# Patient Record
Sex: Female | Born: 1980 | Race: White | Hispanic: No | Marital: Married | Smoking: Never smoker
Health system: Southern US, Community
[De-identification: ages and names within clinical notes are randomized; demographics above are authoritative.]

## PROBLEM LIST (undated history)

## (undated) DIAGNOSIS — K589 Irritable bowel syndrome without diarrhea: Secondary | ICD-10-CM

## (undated) DIAGNOSIS — E785 Hyperlipidemia, unspecified: Secondary | ICD-10-CM

## (undated) DIAGNOSIS — K5909 Other constipation: Secondary | ICD-10-CM

## (undated) HISTORY — PX: TOE SURGERY: SHX1073

## (undated) HISTORY — DX: Irritable bowel syndrome without diarrhea: K58.9

## (undated) HISTORY — DX: Other constipation: K59.09

## (undated) HISTORY — DX: Hyperlipidemia, unspecified: E78.5

## (undated) HISTORY — PX: OTHER SURGICAL HISTORY: SHX169

---

## 2018-08-20 ENCOUNTER — Telehealth (INDEPENDENT_AMBULATORY_CARE_PROVIDER_SITE_OTHER): Payer: Medicare HMO | Admitting: Obstetrics & Gynecology

## 2018-08-20 ENCOUNTER — Encounter: Payer: Self-pay | Admitting: Obstetrics & Gynecology

## 2018-08-20 ENCOUNTER — Other Ambulatory Visit: Payer: Self-pay

## 2018-08-20 DIAGNOSIS — N938 Other specified abnormal uterine and vaginal bleeding: Secondary | ICD-10-CM

## 2018-08-20 NOTE — Progress Notes (Signed)
TELEHEALTH visit with patient  Patient complaining of irregular bleeding on nexplanon.

## 2018-08-20 NOTE — Progress Notes (Signed)
   TELEHEALTH VIRTUAL GYNECOLOGY VISIT ENCOUNTER NOTE  I connected with Ashley Avila on 08/20/18 at  3:45 PM EDT by telephone at home and verified that I am speaking with the correct person using two identifiers.   I discussed the limitations, risks, security and privacy concerns of performing an evaluation and management service by telephone and the availability of in person appointments. I also discussed with the patient that there may be a patient responsible charge related to this service. The patient expressed understanding and agreed to proceed.   History:  Ashley Avila is a 38 y.o. separated G0P0000 female being evaluated today for irregular bleeding with Nexplanon. She had a Nexplanon placed at [facility] about 2 years. She started having irregular bleeding "here recently". She had not had a period for almost a year. She is using the Nexplanon for management of dysmenorrhea. She has lost over 100 pounds in the last year.  She is not currently sexually active, abstinent for about 2 months.    ROS On disability with mental issues.   Past Medical History:  Diagnosis Date  . Chronic constipation   . Hyperlipidemia    Past Surgical History:  Procedure Laterality Date  . galbladder    . hernated disc    . TOE SURGERY     The following portions of the patient's history were reviewed and updated as appropriate: allergies, current medications, past family history, past medical history, past social history, past surgical history and problem list.   Health Maintenance:  Normal pap and negative HRHPV in 2019.    Review of Systems:  Pertinent items noted in HPI and remainder of comprehensive ROS otherwise negative. She has some incontinence of stool and urine after a back surgery done for back pain.  Physical Exam:  Physical exam deferred due to nature of the encounter  Labs and Imaging No results found for this or any previous visit (from the past 336 hour(s)). No results found.     Assessment and Plan:    irregular bleeding with nexplanon - I will need to check TSH, CBC, and cervical cultures and gyn u/s Come in for her annual already scheduled in May.      I discussed the assessment and treatment plan with the patient. The patient was provided an opportunity to ask questions and all were answered. The patient agreed with the plan and demonstrated an understanding of the instructions.   The patient was advised to call back or seek an in-person evaluation/go to the ED if the symptoms worsen or if the condition fails to improve as anticipated.  I provided 10 minutes of non-face-to-face time during this encounter.   Allie Bossier, MD [facility] [employer], [facility]

## 2018-08-21 ENCOUNTER — Ambulatory Visit
Admission: EM | Admit: 2018-08-21 | Discharge: 2018-08-21 | Discharge status: Absent without leave | Payer: Medicare HMO | Attending: Family Medicine | Admitting: Family Medicine

## 2018-08-21 NOTE — ED Provider Notes (Signed)
Encounter with wrong patient information. Disregard.  Everlene Other DO [facility]    Tommie Sams, [place] 08/21/18 1724

## 2018-10-06 ENCOUNTER — Other Ambulatory Visit: Payer: Self-pay

## 2018-10-06 DIAGNOSIS — N926 Irregular menstruation, unspecified: Secondary | ICD-10-CM

## 2018-10-06 DIAGNOSIS — N938 Other specified abnormal uterine and vaginal bleeding: Secondary | ICD-10-CM

## 2018-10-06 NOTE — Progress Notes (Signed)
error 

## 2018-10-07 ENCOUNTER — Other Ambulatory Visit: Payer: Self-pay

## 2018-10-07 DIAGNOSIS — N938 Other specified abnormal uterine and vaginal bleeding: Secondary | ICD-10-CM

## 2018-10-07 LAB — CBC
Hematocrit: 39.1 % (ref 34.0–46.6)
Hemoglobin: 13.4 g/dL (ref 11.1–15.9)
MCH: 31.5 pg (ref 26.6–33.0)
MCHC: 34.3 g/dL (ref 31.5–35.7)
MCV: 92 fL (ref 79–97)
Platelets: 317 10*3/uL (ref 150–450)
RBC: 4.25 x10E6/uL (ref 3.77–5.28)
RDW: 12.4 % (ref 11.7–15.4)
WBC: 9.8 10*3/uL (ref 3.4–10.8)

## 2018-10-07 LAB — TSH: TSH: 5.79 u[IU]/mL — ABNORMAL HIGH (ref 0.450–4.500)

## 2018-10-08 ENCOUNTER — Other Ambulatory Visit: Payer: Self-pay | Admitting: Obstetrics & Gynecology

## 2018-10-08 ENCOUNTER — Telehealth: Payer: Self-pay

## 2018-10-08 DIAGNOSIS — R7989 Other specified abnormal findings of blood chemistry: Secondary | ICD-10-CM

## 2018-10-08 NOTE — Progress Notes (Unsigned)
fre

## 2018-10-08 NOTE — Telephone Encounter (Signed)
Called patient to tell her about additional thyroid test Dr. Marice Potter ordered. Patient states she isnt going to do them. Patient states she just had a virtual visit with her PCP and she had been trying to only take her synthroid every other day and her PCP recommended the patient take it daily like it was prescribed. So patient states she is going to begin taking her synthroid again and not come in for the labwork. Armandina Stammer RN

## 2018-10-13 ENCOUNTER — Ambulatory Visit: Payer: Self-pay | Admitting: Obstetrics & Gynecology

## 2018-10-13 ENCOUNTER — Ambulatory Visit (HOSPITAL_BASED_OUTPATIENT_CLINIC_OR_DEPARTMENT_OTHER): Payer: Medicare HMO

## 2018-10-30 ENCOUNTER — Telehealth: Payer: Self-pay

## 2018-10-30 NOTE — Telephone Encounter (Signed)
Unable to leave message because vm has not been set up.

## 2018-10-30 NOTE — Telephone Encounter (Signed)
-----   Message from Marti Sleigh, [place] sent at 10/30/2018  1:39 PM EDT ----- Regarding: Bleeding Patient called and stated that she has been bleeding while urinating and passing large clots.  She is not light-headed, but having headaches.  [place] scheduled for 11/03/2018 and Annual with Dr. Derwood Kaplan on 11/24/2018.  She would like for someone to give her a call back.  thanks

## 2018-11-03 ENCOUNTER — Other Ambulatory Visit: Payer: Self-pay

## 2018-11-03 ENCOUNTER — Ambulatory Visit (HOSPITAL_BASED_OUTPATIENT_CLINIC_OR_DEPARTMENT_OTHER)
Admission: RE | Admit: 2018-11-03 | Discharge: 2018-11-03 | Disposition: A | Discharge status: Home / Self Care | Payer: Medicare HMO | Source: Ambulatory Visit | Attending: Obstetrics & Gynecology | Admitting: Obstetrics & Gynecology

## 2018-11-03 DIAGNOSIS — N938 Other specified abnormal uterine and vaginal bleeding: Secondary | ICD-10-CM

## 2018-11-24 ENCOUNTER — Other Ambulatory Visit (HOSPITAL_COMMUNITY)
Admission: RE | Admit: 2018-11-24 | Discharge: 2018-11-24 | Disposition: A | Discharge status: Home / Self Care | Payer: Medicare HMO | Source: Ambulatory Visit | Attending: Obstetrics & Gynecology | Admitting: Obstetrics & Gynecology

## 2018-11-24 ENCOUNTER — Other Ambulatory Visit: Payer: Self-pay

## 2018-11-24 ENCOUNTER — Encounter: Payer: Self-pay | Admitting: Obstetrics & Gynecology

## 2018-11-24 ENCOUNTER — Ambulatory Visit (INDEPENDENT_AMBULATORY_CARE_PROVIDER_SITE_OTHER): Payer: Medicare HMO | Admitting: Obstetrics & Gynecology

## 2018-11-24 VITALS — BP 117/58 | HR 69 | Ht 64.0 in | Wt 308.0 lb

## 2018-11-24 DIAGNOSIS — Z01419 Encounter for gynecological examination (general) (routine) without abnormal findings: Secondary | ICD-10-CM | POA: Diagnosis present

## 2018-11-24 DIAGNOSIS — N898 Other specified noninflammatory disorders of vagina: Secondary | ICD-10-CM | POA: Diagnosis present

## 2018-11-24 NOTE — Progress Notes (Signed)
Subjective:     Ashley Avila is a 38 y.o. female here for a routine exam.  Current complaints: Pt for annual with PAP. She reports irreg bleeding with the Nexplanon.  Pt reports that she didn't have menses for 1 year with the Nexplanon then had a few episodes of bleeding. She was not aware that she could have menses with the Nexplanon.    Gynecologic History Patient's last menstrual period was 10/28/2018 (exact date). Contraception: Nexplanon Last Pap: unknown. Results were: normal Last mammogram: n/a.   Obstetric History OB History  Gravida Para Term Preterm AB Living  0 0 0 0 0 0  SAB TAB Ectopic Multiple Live Births  0 0 0 0 0   The following portions of the patient's history were reviewed and updated as appropriate: allergies, current medications, past family history, past medical history, past social history, past surgical history and problem list.  Review of Systems Pt reports that since she had back surgery she has had incontinence with bladder that shis beign followed for and constipation which she is seeing GI for. She has a neuro stim device to assist with these sx.          Objective:  BP (!) 117/58   Pulse 69   Ht 5\' 4"  (1.626 m)   Wt (!) 308 lb (139.7 kg)   LMP 10/28/2018 (Exact Date)   BMI 52.87 kg/m   General Appearance:    Alert, cooperative, no distress, appears stated age  Head:    Normocephalic, without obvious abnormality, atraumatic  Eyes:    conjunctiva/corneas clear, EOM's intact, both eyes  Ears:    Normal external ear canals, both ears  Nose:   Nares normal, septum midline, mucosa normal, no drainage    or sinus tenderness  Throat:   Lips, mucosa, and tongue normal; teeth and gums normal  Neck:   Supple, symmetrical, trachea midline, no adenopathy;    thyroid:  no enlargement/tenderness/nodules  Back:     Symmetric, no curvature, ROM normal, no CVA tenderness  Lungs:     respirations unlabored  Chest Wall:    No tenderness or deformity   Heart:     Regular rate and rhythm  Breast Exam:    No tenderness, masses, or nipple abnormality  Abdomen:     Soft, non-tender, bowel sounds active all four quadrants,    no masses, no organomegaly  Genitalia:    Normal female without lesion or tenderness. There is discharge which is thick and yellow      Extremities:   Extremities normal, atraumatic, no cyanosis or edema  Pulses:   2+ and symmetric all extremities  Skin:   Skin color, texture, turgor normal, no rashes or lesions     Assessment:    Healthy female exam.   Discharge with odor Irreg menses with Nexplanon- d/w pt managemetn options. She would like to keep the Nexplanon for now. She will call if her sx get worse. She did not want a scheduled f/u at present. She thinks she can tolerate the occ bleeding.      Plan:   F/u PAP and cx F/u Affirm F/u in 1 year for annual Mammography beginning in 1 year at age 53 F/u sooner prn more abnormal bleeding on Nexplanon  Jethro Radke L. Harraway-Smith, M.D., Cherlynn June

## 2018-11-24 NOTE — Patient Instructions (Signed)
Etonogestrel implant What is this medicine? ETONOGESTREL (et oh noe JES trel) is a contraceptive (birth control) device. It is used to prevent pregnancy. It can be used for up to 3 years. This medicine may be used for other purposes; ask your health care provider or pharmacist if you have questions. COMMON BRAND NAME(S): Implanon, Nexplanon What should I tell my health care provider before I take this medicine? They need to know if you have any of these conditions:  abnormal vaginal bleeding  blood vessel disease or blood clots  breast, cervical, endometrial, ovarian, liver, or uterine cancer  diabetes  gallbladder disease  heart disease or recent heart attack  high blood pressure  high cholesterol or triglycerides  kidney disease  liver disease  migraine headaches  seizures  stroke  tobacco smoker  an unusual or allergic reaction to etonogestrel, anesthetics or antiseptics, other medicines, foods, dyes, or preservatives  pregnant or trying to get pregnant  breast-feeding How should I use this medicine? This device is inserted just under the skin on the inner side of your upper arm by a health care professional. Talk to your pediatrician regarding the use of this medicine in children. Special care may be needed. Overdosage: If you think you have taken too much of this medicine contact a poison control center or emergency room at once. NOTE: This medicine is only for you. Do not share this medicine with others. What if I miss a dose? This does not apply. What may interact with this medicine? Do not take this medicine with any of the following medications:  amprenavir  fosamprenavir This medicine may also interact with the following medications:  acitretin  aprepitant  armodafinil  bexarotene  bosentan  carbamazepine  certain medicines for fungal infections like fluconazole, ketoconazole, itraconazole and voriconazole  certain medicines to treat  hepatitis, HIV or AIDS  cyclosporine  felbamate  griseofulvin  lamotrigine  modafinil  oxcarbazepine  phenobarbital  phenytoin  primidone  rifabutin  rifampin  rifapentine  St. John's wort  topiramate This list may not describe all possible interactions. Give your health care provider a list of all the medicines, herbs, non-prescription drugs, or dietary supplements you use. Also tell them if you smoke, drink alcohol, or use illegal drugs. Some items may interact with your medicine. What should I watch for while using this medicine? This product does not protect you against HIV infection (AIDS) or other sexually transmitted diseases. You should be able to feel the implant by pressing your fingertips over the skin where it was inserted. Contact your doctor if you cannot feel the implant, and use a non-hormonal birth control method (such as condoms) until your doctor confirms that the implant is in place. Contact your doctor if you think that the implant may have broken or become bent while in your arm. You will receive a user card from your health care provider after the implant is inserted. The card is a record of the location of the implant in your upper arm and when it should be removed. Keep this card with your health records. What side effects may I notice from receiving this medicine? Side effects that you should report to your doctor or health care professional as soon as possible:  allergic reactions like skin rash, itching or hives, swelling of the face, lips, or tongue  breast lumps, breast tissue changes, or discharge  breathing problems  changes in emotions or moods  if you feel that the implant may have broken or   bent while in your arm  high blood pressure  pain, irritation, swelling, or bruising at the insertion site  scar at site of insertion  signs of infection at the insertion site such as fever, and skin redness, pain or discharge  signs and  symptoms of a blood clot such as breathing problems; changes in vision; chest pain; severe, sudden headache; pain, swelling, warmth in the leg; trouble speaking; sudden numbness or weakness of the face, arm or leg  signs and symptoms of liver injury like dark yellow or brown urine; general ill feeling or flu-like symptoms; light-colored stools; loss of appetite; nausea; right upper belly pain; unusually weak or tired; yellowing of the eyes or skin  unusual vaginal bleeding, discharge Side effects that usually do not require medical attention (report to your doctor or health care professional if they continue or are bothersome):  acne  breast pain or tenderness  headache  irregular menstrual bleeding  nausea This list may not describe all possible side effects. Call your doctor for medical advice about side effects. You may report side effects to FDA at 1-800-FDA-1088. Where should I keep my medicine? This drug is given in a hospital or clinic and will not be stored at home. NOTE: This sheet is a summary. It may not cover all possible information. If you have questions about this medicine, talk to your doctor, pharmacist, or health care provider.  2020 Elsevier/Gold Standard (2017-04-02 14:11:42)  

## 2018-11-25 LAB — CYTOLOGY - PAP
Diagnosis: NEGATIVE
HPV: NOT DETECTED

## 2018-11-25 LAB — CERVICOVAGINAL ANCILLARY ONLY
Bacterial vaginitis: POSITIVE — AB
Candida vaginitis: NEGATIVE

## 2018-11-26 ENCOUNTER — Telehealth: Payer: Self-pay

## 2018-11-26 MED ORDER — METRONIDAZOLE 500 MG PO TABS: 500.0000 mg | ORAL_TABLET | Freq: Two times a day (BID) | ORAL | 0 refills | Status: AC

## 2018-11-26 NOTE — Telephone Encounter (Signed)
Unable to leave message for patient. Patient pap WNL Bacterial vaginosis positive. Will send prescription in.

## 2018-12-02 ENCOUNTER — Telehealth: Payer: Self-pay

## 2018-12-02 NOTE — Telephone Encounter (Signed)
Pt called requesting results. Pt made aware that she tested positive for BV. Understanding was voiced. Graclynn Vanantwerp l Alara Daniel, CMA

## 2019-11-27 ENCOUNTER — Ambulatory Visit: Payer: Medicare HMO | Admitting: Family Medicine

## 2019-12-10 ENCOUNTER — Ambulatory Visit: Payer: Medicare HMO | Admitting: Family Medicine

## 2020-01-13 ENCOUNTER — Encounter: Payer: Self-pay | Admitting: Obstetrics and Gynecology

## 2020-01-13 ENCOUNTER — Other Ambulatory Visit: Payer: Self-pay

## 2020-01-13 ENCOUNTER — Other Ambulatory Visit (HOSPITAL_COMMUNITY)
Admission: RE | Admit: 2020-01-13 | Discharge: 2020-01-13 | Disposition: A | Discharge status: Home / Self Care | Payer: Medicare HMO | Source: Ambulatory Visit | Attending: Obstetrics and Gynecology | Admitting: Obstetrics and Gynecology

## 2020-01-13 ENCOUNTER — Ambulatory Visit (INDEPENDENT_AMBULATORY_CARE_PROVIDER_SITE_OTHER): Payer: Medicare HMO | Admitting: Obstetrics and Gynecology

## 2020-01-13 VITALS — BP 105/60 | HR 76 | Ht 64.0 in | Wt 293.0 lb

## 2020-01-13 DIAGNOSIS — Z7189 Other specified counseling: Secondary | ICD-10-CM | POA: Diagnosis not present

## 2020-01-13 DIAGNOSIS — Z124 Encounter for screening for malignant neoplasm of cervix: Secondary | ICD-10-CM | POA: Diagnosis present

## 2020-01-13 DIAGNOSIS — Z3009 Encounter for other general counseling and advice on contraception: Secondary | ICD-10-CM | POA: Diagnosis not present

## 2020-01-13 DIAGNOSIS — Z1231 Encounter for screening mammogram for malignant neoplasm of breast: Secondary | ICD-10-CM | POA: Diagnosis not present

## 2020-01-13 DIAGNOSIS — N939 Abnormal uterine and vaginal bleeding, unspecified: Secondary | ICD-10-CM

## 2020-01-13 DIAGNOSIS — N898 Other specified noninflammatory disorders of vagina: Secondary | ICD-10-CM

## 2020-01-13 DIAGNOSIS — Z01419 Encounter for gynecological examination (general) (routine) without abnormal findings: Secondary | ICD-10-CM | POA: Diagnosis not present

## 2020-01-13 NOTE — Progress Notes (Signed)
GYNECOLOGY ANNUAL PREVENTATIVE CARE ENCOUNTER NOTE  Subjective:   Ashley Avila is a 39 y.o. G0P0000 female here for a annual gynecologic exam. Current complaints: irregular bleeding. Has had nexplanon in ~5 years, has very irregular bleeding with it now, reports she used to have no bleeding but has started having significant bleeding, unpredictable periods lasting 10 days sometimes, painful cramping. Would like something to stop periods and cramping. Also in need of contraception as she does not want children. Also reports finding HIV meds in boyfriend's possession and is worried about transmission.     Does report some vaginal odor and uses vaginal spray for it. Denies discharge, pelvic pain, problems with intercourse or other gynecologic concerns.   Gynecologic History No LMP recorded. Patient has had an implant. Contraception: Nexplanon Last Pap: 10/2018. Results: normal Last mammogram: never had DEXA: has never had  Obstetric History OB History  Gravida Para Term Preterm AB Living  0 0 0 0 0 0  SAB TAB Ectopic Multiple Live Births  0 0 0 0 0    Past Medical History:  Diagnosis Date  . Chronic constipation   . Hyperlipidemia   . Irritable bowel syndrome (IBS)     Past Surgical History:  Procedure Laterality Date  . galbladder    . hernated disc    . TOE SURGERY      Current Outpatient Medications on File Prior to Visit  Medication Sig Dispense Refill  . albuterol (VENTOLIN HFA) 108 (90 Base) MCG/ACT inhaler Inhale into the lungs every 6 (six) hours as needed for wheezing or shortness of breath.    Marland Kitchen ammonium lactate (AMLACTIN) 12 % cream Apply topically as needed for dry skin.    . Ascorbic Acid (VITAMIN C) 100 MG tablet Take 100 mg by mouth daily.    . Botulinum Toxin Type A (BOTOX) 200 units SOLR Inject as directed.    Marland Kitchen Dexlansoprazole (DEXILANT PO) Take by mouth.    . diclofenac Sodium (VOLTAREN) 1 % GEL Apply topically 4 (four) times daily.    . Etonogestrel  (NEXPLANON [place]) Inject into the skin.    . famotidine (PEPCID) 40 MG tablet Take 40 mg by mouth daily.    Marland Kitchen gabapentin (NEURONTIN) 600 MG tablet Take 600 mg by mouth 3 (three) times daily.    Marland Kitchen ipratropium (ATROVENT) 0.03 % nasal spray Place 2 sprays into both nostrils every 12 (twelve) hours.    Marland Kitchen ketoconazole (NIZORAL) 2 % shampoo     . lamoTRIgine (LAMICTAL) 25 MG tablet Take 25 mg by mouth daily.    Marland Kitchen levothyroxine (SYNTHROID, LEVOTHROID) 100 MCG tablet     . Melatonin 1 MG TABS Take by mouth.    . methocarbamol (ROBAXIN) 500 MG tablet Take 500 mg by mouth 4 (four) times daily.    . montelukast (SINGULAIR) 10 MG tablet     . Multiple Vitamin (MULTIVITAMIN) capsule Take 1 capsule by mouth daily.    . Multiple Vitamin (MULTIVITAMIN) LIQD Take 5 mLs by mouth daily.    Marland Kitchen nystatin (MYCOSTATIN/NYSTOP) powder Apply 1 application topically 3 (three) times daily.    . pantoprazole (PROTONIX) 40 MG tablet     . potassium chloride SA (K-DUR,KLOR-CON) 20 MEQ tablet     . primidone (MYSOLINE) 50 MG tablet Take by mouth 4 (four) times daily.    . prochlorperazine (COMPAZINE) 5 MG tablet Take 5 mg by mouth every 6 (six) hours as needed for nausea or vomiting.    . propranolol (  INDERAL) 20 MG tablet Take 20 mg by mouth 3 (three) times daily.    . traZODone (DESYREL) 100 MG tablet Take 100 mg by mouth at bedtime.    . vortioxetine HBr (TRINTELLIX) 10 MG TABS tablet Take 10 mg by mouth daily.    Marland Kitchen zonisamide (ZONEGRAN) 25 MG capsule Take 25 mg by mouth daily.    . cholestyramine (QUESTRAN) 4 GM/DOSE powder  (Patient not taking: Reported on 01/13/2020)    . metroNIDAZOLE (FLAGYL) 500 MG tablet Take 1 tablet (500 mg total) by mouth 2 (two) times daily. (Patient not taking: Reported on 01/13/2020) 14 tablet 0   No current facility-administered medications on file prior to visit.    Allergies  Allergen Reactions  . Hydrocodone Other (See Comments)    Headache   . Latex Hives  . Tape Hives    Social  History   Socioeconomic History  . Marital status: Married    Spouse name: Not on file  . Number of children: Not on file  . Years of education: Not on file  . Highest education level: Not on file  Occupational History  . Not on file  Tobacco Use  . Smoking status: Never Smoker  . Smokeless tobacco: Never Used  Vaping Use  . Vaping Use: Never used  Substance and Sexual Activity  . Alcohol use: Not Currently  . Drug use: Never  . Sexual activity: Yes  Other Topics Concern  . Not on file  Social History Narrative  . Not on file   Social Determinants of Health   Financial Resource Strain:   . Difficulty of Paying Living Expenses:   Food Insecurity:   . Worried About Programme researcher, broadcasting/film/video in the Last Year:   . Barista in the Last Year:   Transportation Needs:   . Freight forwarder (Medical):   Marland Kitchen Lack of Transportation (Non-Medical):   Physical Activity:   . Days of Exercise per Week:   . Minutes of Exercise per Session:   Stress:   . Feeling of Stress :   Social Connections:   . Frequency of Communication with Friends and Family:   . Frequency of Social Gatherings with Friends and Family:   . Attends Religious Services:   . Active Member of Clubs or Organizations:   . Attends Banker Meetings:   Marland Kitchen Marital Status:   Intimate Partner Violence:   . Fear of Current or Ex-Partner:   . Emotionally Abused:   Marland Kitchen Physically Abused:   . Sexually Abused:     Family History  Problem Relation Age of Onset  . Hypertension Mother   . Diabetes Father     The following portions of the patient's history were reviewed and updated as appropriate: allergies, current medications, past family history, past medical history, past social history, past surgical history and problem list.  Review of Systems Pertinent items are noted in HPI.   Objective:  BP 105/60   Pulse 76   Ht 5\' 4"  (1.626 m)   Wt 293 lb (132.9 kg)   BMI 50.29 kg/m  CONSTITUTIONAL:  Well-developed, well-nourished female in no acute distress.  HENT:  Normocephalic, atraumatic, External right and left ear normal. Oropharynx is clear and moist EYES: Conjunctivae and EOM are normal. Pupils are equal, round, and reactive to light. No scleral icterus.  NECK: Normal range of motion, supple, no masses.  Normal thyroid.  SKIN: Skin is warm and dry. No rash noted. Not diaphoretic.  No erythema. No pallor. NEUROLOGIC: Alert and oriented to person, place, and time. Normal reflexes, muscle tone coordination. No cranial nerve deficit noted. PSYCHIATRIC: Normal mood and affect. Normal behavior. Normal judgment and thought content. CARDIOVASCULAR: Normal heart rate noted RESPIRATORY: Effort s normal, no problems with respiration noted. BREASTS: declines breast exam ABDOMEN: Soft, no distention noted.    PELVIC: Normal appearing external genitalia; normal appearing vaginal mucosa and cervix.  No abnormal discharge noted.  Pelvic cultures obtained. Normal uterine size, no other palpable masses, no uterine or adnexal tenderness. MUSCULOSKELETAL: Normal range of motion. No tenderness.  No cyanosis, clubbing, or edema.  2+ distal pulses.  Exam done with chaperone present.   Assessment and Plan:   1. Well woman exam Healthy exam  2. Encounter for screening mammogram for malignant neoplasm of breast - MM DIGITAL SCREENING BILATERAL; Future - HIV antibody (with reflex) - Hepatitis C Antibody - Hepatitis B Surface AntiGEN - RPR - Cervicovaginal ancillary only  3. Vaginal odor Swab today Encouraged her to not use vaginal spray/odor and present for any concerns for infection  4. Counseled about COVID-19 virus infection The patient was counseled on the potential benefits and lack of known risks of COVID vaccination, during pregnancy and breastfeeding, on today's visit. The patient's questions and concerns were addressed today, including reporting that friends have "died after getting the  vaccine". The patient is not planning to get vaccinated at this time. The patient is aware that if she chooses not to get an employee mandated vaccination we will provide documentation for her employers in the form of a letter unless a specific exemption form is submitted to the provider.   5. Encounter for counseling regarding contraception Pt desires contraception, interested in BTL but after advising that it will not improve periods, she is less interested. Reviewed all options for contracpetion, she has been on OCPs, depo, nexplanon and IUD before. Declines nuvaring. Would like to try IUD again  6. Abnormal uterine bleeding (AUB) - may need EMB if not responsive to hormonal management - US PELVIC COMPLETE WITH TRANSVAGINAL; Future   Will follow up results of STI screen and manage accordingly. Encouraged improvement in diet and exercise.  COVID vaccine declines Accepts STI screen. Mammogram ordered today Referral for colonoscopy n/a  Routine preventative health maintenance measures emphasized. Please refer to After Visit Summary for other counseling recommendations.   Total face-to-face time with patient: 35 minutes. Over 50% of encounter was spent on counseling and coordination of care.   Baldemar Lenis, M.D. Attending [facility] [employer] [facility])

## 2020-01-14 LAB — HIV ANTIBODY (ROUTINE TESTING W REFLEX): HIV Screen 4th Generation wRfx: NONREACTIVE

## 2020-01-14 LAB — HEPATITIS B SURFACE ANTIGEN: Hepatitis B Surface Ag: NEGATIVE

## 2020-01-14 LAB — HEPATITIS C ANTIBODY: Hep C Virus Ab: 0.2 s/co ratio (ref 0.0–0.9)

## 2020-01-14 LAB — RPR: RPR Ser Ql: NONREACTIVE

## 2020-01-15 LAB — CERVICOVAGINAL ANCILLARY ONLY
Bacterial Vaginitis (gardnerella): POSITIVE — AB
Candida Glabrata: NEGATIVE
Candida Vaginitis: POSITIVE — AB
Chlamydia: NEGATIVE
Comment: NEGATIVE
Comment: NEGATIVE
Comment: NEGATIVE
Comment: NEGATIVE
Comment: NEGATIVE
Comment: NORMAL
Neisseria Gonorrhea: NEGATIVE
Trichomonas: NEGATIVE

## 2020-01-18 ENCOUNTER — Telehealth: Payer: Self-pay

## 2020-01-18 DIAGNOSIS — B379 Candidiasis, unspecified: Secondary | ICD-10-CM

## 2020-01-18 DIAGNOSIS — B9689 Other specified bacterial agents as the cause of diseases classified elsewhere: Secondary | ICD-10-CM

## 2020-01-18 MED ORDER — METRONIDAZOLE 500 MG PO TABS: 500.0000 mg | ORAL_TABLET | Freq: Two times a day (BID) | ORAL | 0 refills | Status: AC

## 2020-01-18 MED ORDER — FLUCONAZOLE 150 MG PO TABS: ORAL_TABLET | ORAL | 1 refills | Status: AC

## 2020-01-18 NOTE — Telephone Encounter (Signed)
Called pt regarding results. Pt made aware that she has BV and yeast. Diflucan 150 mg 1 tab PO and Flagyl 500 mg 1 tab BID x 7 days was sent to the pharmacy. Advised pt to take Flagyl for full 7 days then take Diflucan. Understanding was voiced. Nika Yazzie l Jorrell Kuster, CMA

## 2020-01-21 ENCOUNTER — Ambulatory Visit (HOSPITAL_BASED_OUTPATIENT_CLINIC_OR_DEPARTMENT_OTHER): Payer: Medicare HMO

## 2020-01-21 ENCOUNTER — Encounter: Payer: Medicare HMO | Admitting: Family Medicine

## 2020-01-25 ENCOUNTER — Ambulatory Visit (HOSPITAL_BASED_OUTPATIENT_CLINIC_OR_DEPARTMENT_OTHER)
Admission: RE | Admit: 2020-01-25 | Discharge: 2020-01-25 | Disposition: A | Discharge status: Home / Self Care | Payer: Medicare HMO | Source: Ambulatory Visit | Attending: Obstetrics and Gynecology | Admitting: Obstetrics and Gynecology

## 2020-01-25 ENCOUNTER — Other Ambulatory Visit: Payer: Self-pay

## 2020-01-25 ENCOUNTER — Ambulatory Visit (INDEPENDENT_AMBULATORY_CARE_PROVIDER_SITE_OTHER): Payer: Medicare HMO | Admitting: Family Medicine

## 2020-01-25 ENCOUNTER — Encounter (HOSPITAL_BASED_OUTPATIENT_CLINIC_OR_DEPARTMENT_OTHER): Payer: Self-pay

## 2020-01-25 ENCOUNTER — Encounter: Payer: Self-pay | Admitting: Family Medicine

## 2020-01-25 VITALS — BP 97/70 | HR 65 | Wt 302.1 lb

## 2020-01-25 DIAGNOSIS — Z1231 Encounter for screening mammogram for malignant neoplasm of breast: Secondary | ICD-10-CM

## 2020-01-25 DIAGNOSIS — N939 Abnormal uterine and vaginal bleeding, unspecified: Secondary | ICD-10-CM | POA: Diagnosis not present

## 2020-01-25 DIAGNOSIS — Z01812 Encounter for preprocedural laboratory examination: Secondary | ICD-10-CM

## 2020-01-25 DIAGNOSIS — Z30430 Encounter for insertion of intrauterine contraceptive device: Secondary | ICD-10-CM | POA: Diagnosis not present

## 2020-01-25 DIAGNOSIS — Z3046 Encounter for surveillance of implantable subdermal contraceptive: Secondary | ICD-10-CM

## 2020-01-25 LAB — POCT URINE PREGNANCY: Preg Test, Ur: NEGATIVE

## 2020-01-25 MED ORDER — LEVONORGESTREL 19.5 MCG/DAY IU IUD
INTRAUTERINE_SYSTEM | Freq: Once | INTRAUTERINE | Status: AC
  Administered 2020-01-25: 12:00:00 1 via INTRAUTERINE

## 2020-01-25 NOTE — Progress Notes (Signed)
Nexplanon Removal:  Patient given informed consent for removal of her Implanon, time out was performed.  Signed copy in the chart.  Appropriate time out taken. Implanon site identified.  Area prepped in usual sterile fashon. One cc of 1% lidocaine was used to anesthetize the area at the distal end of the implant. A small stab incision was made right beside the implant on the distal portion.  The implanon rod was grasped using hemostats and removed without difficulty.  There was less than 3 cc blood loss. There were no complications.  A small amount of antibiotic ointment and steri-strips were applied over the small incision.  A pressure bandage was applied to reduce any bruising.  The patient tolerated the procedure well and was given post procedure instructions.  IUD Procedure Note Patient identified, informed consent performed, signed copy in chart, time out was performed.  Urine pregnancy test negative.  Speculum placed in the vagina.  Cervix visualized.  Cleaned with Betadine x 2. Paracervical block done with lidocaine 1% 25mL. Grasped anteriorly with a single tooth tenaculum.  Uterus sounded to 9 cm.  Liletta  IUD placed per manufacturer's recommendations.  Strings trimmed to 3 cm. Tenaculum was removed, good hemostasis noted.  Patient tolerated procedure well.   Patient given post procedure instructions and Liletta care card with expiration date.  Patient is asked to check IUD strings periodically and follow up in 4-6 weeks for IUD check.

## 2020-01-25 NOTE — Patient Instructions (Signed)
Nexplanon Instructions After Insertion  Keep bandage clean and dry for 24 hours  May use ice/Tylenol/Ibuprofen for soreness or pain  If you develop fever, drainage or increased warmth from incision site-contact office immediately   

## 2020-02-22 ENCOUNTER — Ambulatory Visit: Payer: Medicare HMO | Admitting: Family Medicine

## 2020-03-03 ENCOUNTER — Ambulatory Visit: Payer: Medicare HMO | Admitting: Family Medicine

## 2021-09-01 IMAGING — MG DIGITAL SCREENING BILAT W/ TOMO W/ CAD
8 of 14 series · 8 of 40 positions shown · non-contrast
Comparison: None.

ACR Breast Density Category a: The breast tissue is almost entirely
fatty.

CLINICAL DATA: Screening.

EXAM:
DIGITAL SCREENING BILATERAL MAMMOGRAM WITH TOMO AND CAD

[L CC synth-2D]
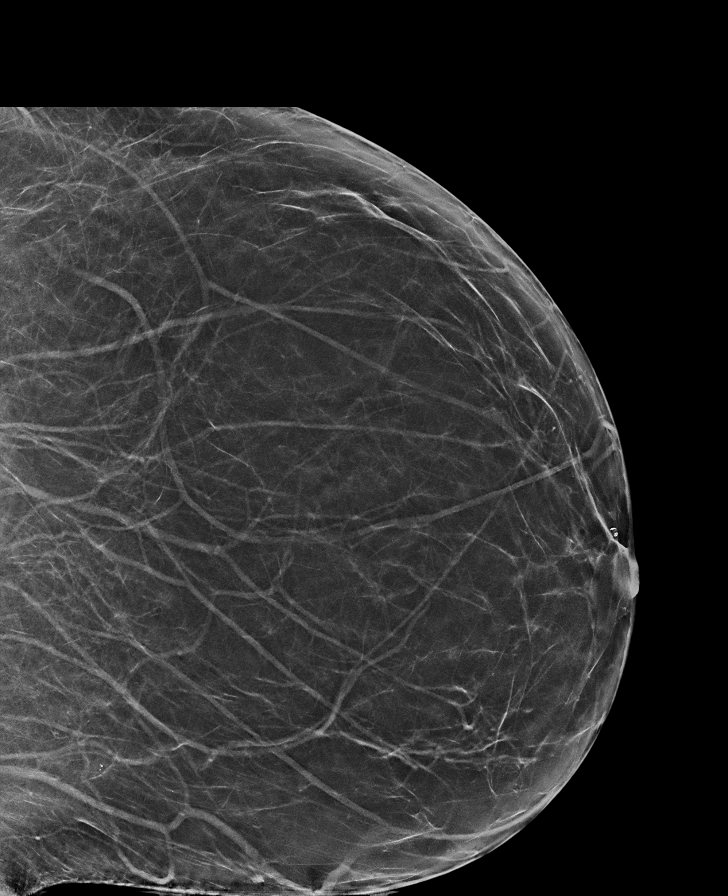

[R MLO synth-2D (1 of 2)]
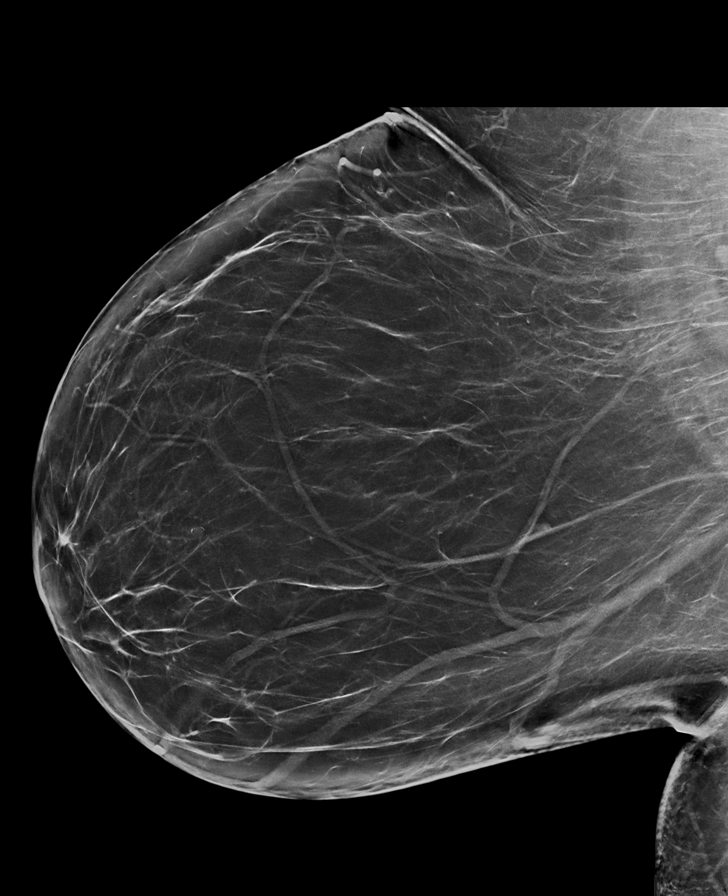

[L CV synth-2D]
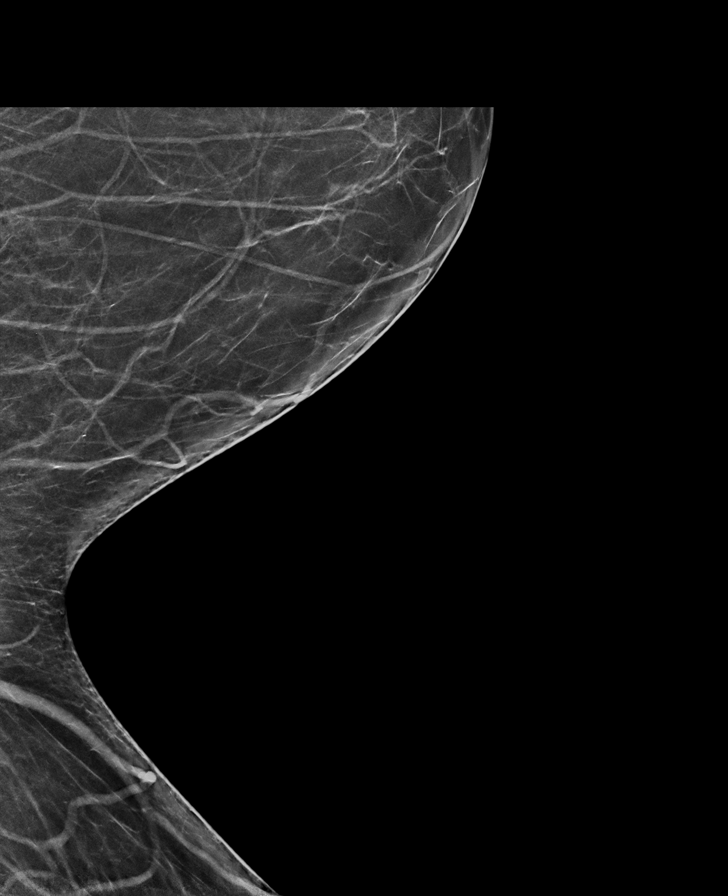

[R MLO synth-2D (2 of 2)]
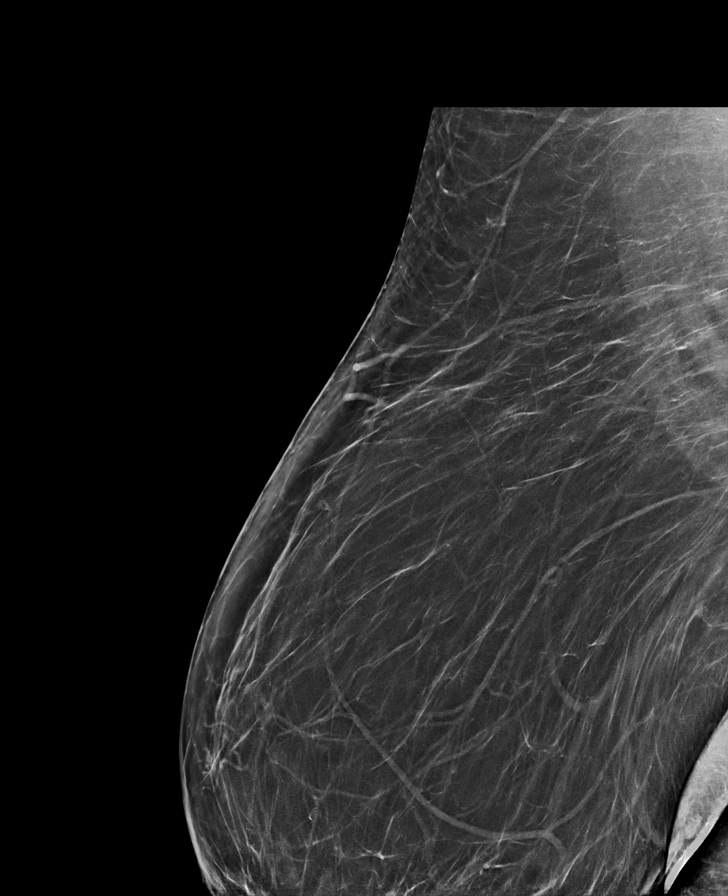

[R CC synth-2D]
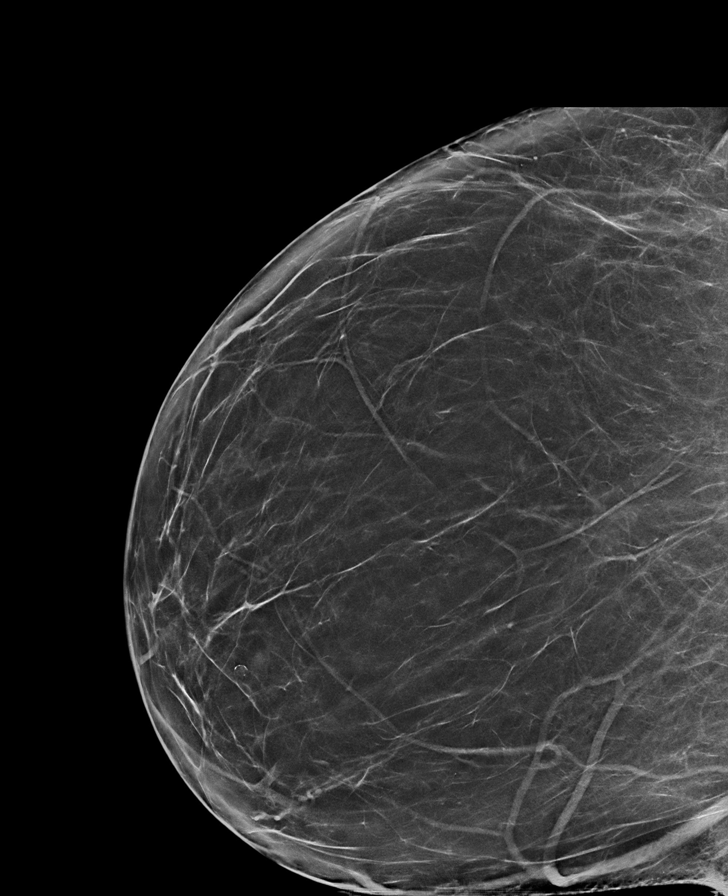

[L MLO synth-2D (1 of 2)]
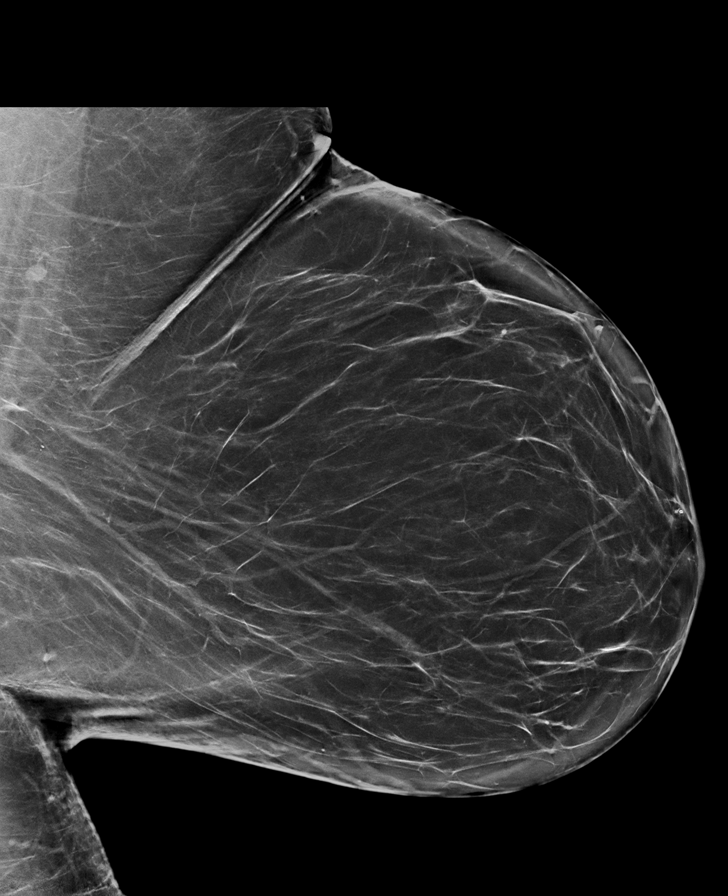

[L MLO synth-2D (2 of 2)]
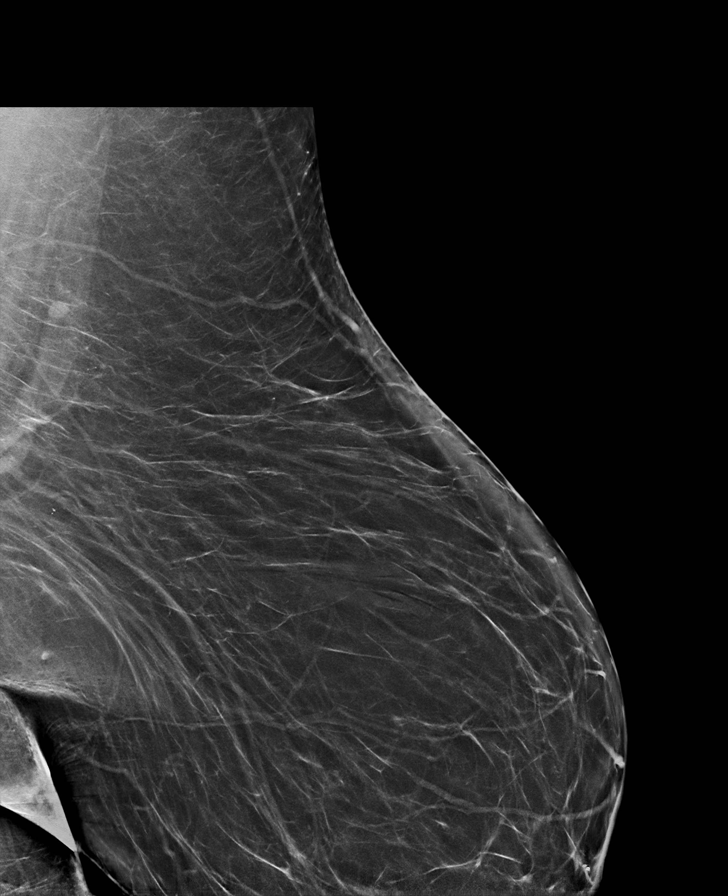

[L CC tomo · tomo slice 35/68.0]
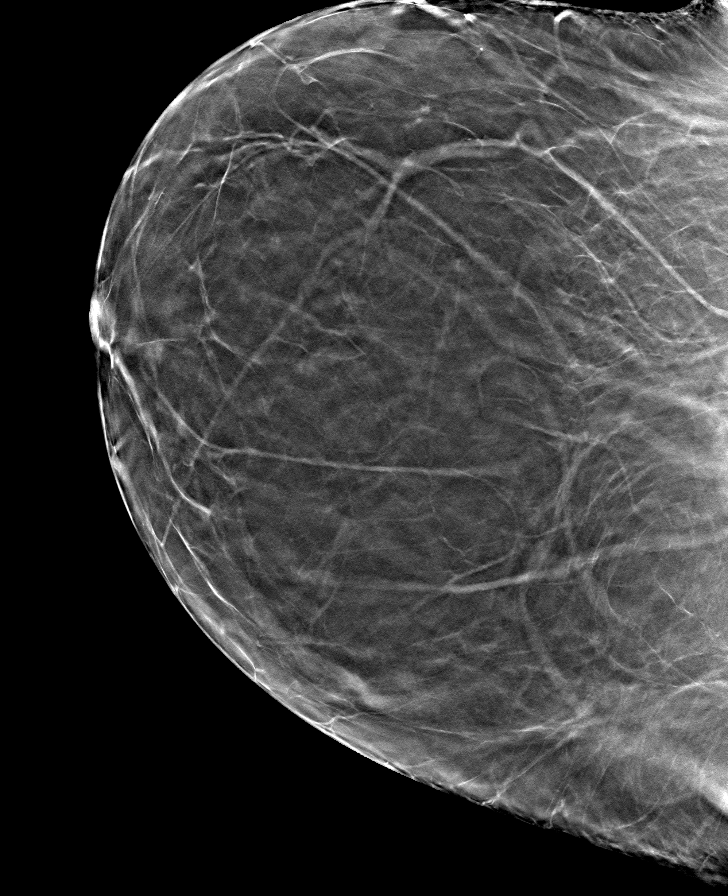

[8 of 40 positions shown; findings below may reference images not displayed]

FINDINGS: There are no findings suspicious for malignancy. Images were
processed with CAD.
IMPRESSION: No mammographic evidence of malignancy. A result letter of this
screening mammogram will be mailed directly to the patient.

RECOMMENDATION:
Screening mammogram in one year. (Code:[id number])

BI-RADS CATEGORY  1: Negative.
